# Patient Record
Sex: Female | Born: 1995 | Race: Black or African American | Hispanic: No | Marital: Single | Smoking: Never smoker
Health system: Southern US, Community
[De-identification: ages and names within clinical notes are randomized; demographics above are authoritative.]

## PROBLEM LIST (undated history)

## (undated) ENCOUNTER — Inpatient Hospital Stay (HOSPITAL_COMMUNITY): Payer: Self-pay

## (undated) DIAGNOSIS — Z789 Other specified health status: Secondary | ICD-10-CM

## (undated) HISTORY — PX: NO PAST SURGERIES: SHX2092

## (undated) HISTORY — DX: Other specified health status: Z78.9

---

## 2014-03-20 ENCOUNTER — Ambulatory Visit (INDEPENDENT_AMBULATORY_CARE_PROVIDER_SITE_OTHER): Payer: Self-pay | Admitting: *Deleted

## 2014-03-20 ENCOUNTER — Encounter: Payer: Self-pay | Admitting: *Deleted

## 2014-03-20 DIAGNOSIS — Z3480 Encounter for supervision of other normal pregnancy, unspecified trimester: Secondary | ICD-10-CM

## 2014-03-20 DIAGNOSIS — Z3201 Encounter for pregnancy test, result positive: Secondary | ICD-10-CM

## 2014-03-20 DIAGNOSIS — Z3490 Encounter for supervision of normal pregnancy, unspecified, unspecified trimester: Secondary | ICD-10-CM

## 2014-03-20 LAB — POCT PREGNANCY, URINE: PREG TEST UR: POSITIVE — AB

## 2014-03-20 NOTE — Progress Notes (Signed)
Here for pregancy test which was postiive. Wants to start prenatal care here. Will draw labs today

## 2014-03-21 LAB — OBSTETRIC PANEL
Antibody Screen: NEGATIVE
BASOS ABS: 0 10*3/uL (ref 0.0–0.1)
Basophils Relative: 0 % (ref 0–1)
EOS PCT: 3 % (ref 0–5)
Eosinophils Absolute: 0.2 10*3/uL (ref 0.0–0.7)
HEMATOCRIT: 32.6 % — AB (ref 36.0–46.0)
Hemoglobin: 11.6 g/dL — ABNORMAL LOW (ref 12.0–15.0)
Hepatitis B Surface Ag: NEGATIVE
LYMPHS ABS: 2 10*3/uL (ref 0.7–4.0)
LYMPHS PCT: 29 % (ref 12–46)
MCH: 29.3 pg (ref 26.0–34.0)
MCHC: 35.6 g/dL (ref 30.0–36.0)
MCV: 82.3 fL (ref 78.0–100.0)
Monocytes Absolute: 0.6 10*3/uL (ref 0.1–1.0)
Monocytes Relative: 8 % (ref 3–12)
NEUTROS ABS: 4.2 10*3/uL (ref 1.7–7.7)
Neutrophils Relative %: 60 % (ref 43–77)
Platelets: 363 10*3/uL (ref 150–400)
RBC: 3.96 MIL/uL (ref 3.87–5.11)
RDW: 13.2 % (ref 11.5–15.5)
RUBELLA: 5.01 {index} — AB (ref <0.90)
Rh Type: POSITIVE
WBC: 7 10*3/uL (ref 4.0–10.5)

## 2014-03-21 LAB — HIV ANTIBODY (ROUTINE TESTING W REFLEX): HIV: NONREACTIVE

## 2014-03-22 LAB — HEMOGLOBINOPATHY EVALUATION
HGB F QUANT: 0 % (ref 0.0–2.0)
Hemoglobin Other: 0 %
Hgb A2 Quant: 2.8 % (ref 2.2–3.2)
Hgb A: 97.2 % (ref 96.8–97.8)
Hgb S Quant: 0 %

## 2014-04-25 ENCOUNTER — Encounter: Payer: Self-pay | Admitting: Obstetrics and Gynecology

## 2014-04-26 ENCOUNTER — Inpatient Hospital Stay (HOSPITAL_COMMUNITY)
Admission: AD | Admit: 2014-04-26 | Discharge: 2014-04-26 | Discharge status: Against Medical Advice | Payer: Medicaid Other | Source: Ambulatory Visit | Attending: Obstetrics and Gynecology | Admitting: Obstetrics and Gynecology

## 2014-04-26 ENCOUNTER — Encounter (HOSPITAL_COMMUNITY): Payer: Self-pay | Admitting: *Deleted

## 2014-04-26 DIAGNOSIS — O26859 Spotting complicating pregnancy, unspecified trimester: Secondary | ICD-10-CM | POA: Insufficient documentation

## 2014-04-26 DIAGNOSIS — R109 Unspecified abdominal pain: Secondary | ICD-10-CM | POA: Insufficient documentation

## 2014-04-26 NOTE — MAU Note (Signed)
Has been cramping and spotting since yesterday.

## 2014-05-08 ENCOUNTER — Encounter: Payer: Self-pay | Admitting: Family Medicine

## 2014-05-08 ENCOUNTER — Ambulatory Visit (INDEPENDENT_AMBULATORY_CARE_PROVIDER_SITE_OTHER): Payer: Medicaid Other | Admitting: Family Medicine

## 2014-05-08 VITALS — BP 117/77 | HR 108 | Temp 97.3°F | Wt 117.3 lb

## 2014-05-08 DIAGNOSIS — Z3480 Encounter for supervision of other normal pregnancy, unspecified trimester: Secondary | ICD-10-CM

## 2014-05-08 DIAGNOSIS — Z3481 Encounter for supervision of other normal pregnancy, first trimester: Secondary | ICD-10-CM

## 2014-05-08 DIAGNOSIS — Z3491 Encounter for supervision of normal pregnancy, unspecified, first trimester: Secondary | ICD-10-CM

## 2014-05-08 DIAGNOSIS — O99810 Abnormal glucose complicating pregnancy: Secondary | ICD-10-CM

## 2014-05-08 DIAGNOSIS — O24419 Gestational diabetes mellitus in pregnancy, unspecified control: Secondary | ICD-10-CM | POA: Insufficient documentation

## 2014-05-08 LAB — POCT URINALYSIS DIP (DEVICE)
Glucose, UA: NEGATIVE mg/dL
HGB URINE DIPSTICK: NEGATIVE
Leukocytes, UA: NEGATIVE
Nitrite: NEGATIVE
PH: 6 (ref 5.0–8.0)
PROTEIN: 30 mg/dL — AB
SPECIFIC GRAVITY, URINE: 1.02 (ref 1.005–1.030)
UROBILINOGEN UA: 1 mg/dL (ref 0.0–1.0)

## 2014-05-08 NOTE — Progress Notes (Signed)
  Subjective:    Tamara HeinzJameisha Smith is a G3P1011 6651w0d being seen today for her first obstetrical visit.  Her obstetrical history is significant for gestational diabetes A1 with previous pregnancy. Patient does intend to breast feed. Pregnancy history fully reviewed.  Patient reports nausea. Has been vomiting throughout the day on some days.  Other days she does fine. Not taking anything.  No spotting recently.  Some cramping.  Was seen in the MAU on 6/8 and told her placenta was "low" on bedside exam but no notes in the system.    Filed Vitals:   05/08/14 1013  BP: 117/77  Pulse: 108  Temp: 97.3 F (36.3 C)  Weight: 117 lb 4.8 oz (53.207 kg)    HISTORY: OB History  Gravida Para Term Preterm AB SAB TAB Ectopic Multiple Living  3 1 1  1  1   1     # Outcome Date GA Lbr Len/2nd Weight Sex Delivery Anes PTL Lv  3 CUR           2 TAB 2012          1 TRM 03/13/10 3714w0d  8 lb (3.629 kg) M SVD EPI N Y     Comments: Esophageal Atresia     Past Medical History  Diagnosis Date  . Medical history non-contributory    Past Surgical History  Procedure Laterality Date  . No past surgeries     Family History  Problem Relation Age of Onset  . Diabetes Mother   . Hypertension Mother   . Hypertension Father   . Diabetes Maternal Aunt   . Diabetes Maternal Grandmother      Exam    Uterus:   deferred as done in MAU   Pelvic Exam:   System: Breast:  deferred per pt request   Skin: normal coloration and turgor, no rashes    Neurologic: normal   Extremities: normal strength, tone, and muscle mass   HEENT extra ocular movement intact   Mouth/Teeth mucous membranes moist, pharynx normal without lesions   Neck supple   Cardiovascular: regular rate and rhythm, no murmurs or gallops   Respiratory:  appears well, vitals normal, no respiratory distress, acyanotic, normal RR, ear and throat exam is normal, neck free of mass or lymphadenopathy, chest clear, no wheezing, crepitations, rhonchi,  normal symmetric air entry   Abdomen: soft, non-tender; bowel sounds normal; no masses,  no organomegaly      Assessment:    Pregnancy: G3P1011 Patient Active Problem List   Diagnosis Date Noted  . Supervision of normal subsequent pregnancy 05/08/2014  . hx of Gestational diabetes mellitus, currently pregnant 05/08/2014        Plan:     Initial labs revieweed Prenatal vitamins. Offered nausea medication but pt declined.  ? Placental location- advised pelvic rest and will schedule for 1st trimester screen and further eval  Will need glucola at next visit as vomited this time.  Problem list reviewed and updated. Genetic Screening discussed First Screen: requested.  Follow up in 4 weeks. 50% of 30 min visit spent on counseling and coordination of care.     BECK, KELI L 05/08/2014

## 2014-05-08 NOTE — Patient Instructions (Signed)

## 2014-05-08 NOTE — Progress Notes (Signed)
First Trimester Screen scheduled 05/11/14 at 11 am with MFM.

## 2014-05-08 NOTE — Progress Notes (Signed)
Initial prenatal information given.  Pt vomited 1 hour gtt drink/

## 2014-05-09 ENCOUNTER — Other Ambulatory Visit: Payer: Self-pay | Admitting: Family Medicine

## 2014-05-09 DIAGNOSIS — Z3682 Encounter for antenatal screening for nuchal translucency: Secondary | ICD-10-CM

## 2014-05-10 LAB — PRESCRIPTION MONITORING PROFILE (19 PANEL)
Amphetamine/Meth: NEGATIVE ng/mL
BARBITURATE SCREEN, URINE: NEGATIVE ng/mL
Benzodiazepine Screen, Urine: NEGATIVE ng/mL
Buprenorphine, Urine: NEGATIVE ng/mL
CANNABINOID SCRN UR: NEGATIVE ng/mL
CARISOPRODOL, URINE: NEGATIVE ng/mL
COCAINE METABOLITES: NEGATIVE ng/mL
CREATININE, URINE: 429.46 mg/dL (ref >20.0)
FENTANYL URINE: NEGATIVE ng/mL
MDMA URINE: NEGATIVE ng/mL
MEPERIDINE UR: NEGATIVE ng/mL
METHADONE SCREEN, URINE: NEGATIVE ng/mL
Methaqualone: NEGATIVE ng/mL
Nitrites, Initial: NEGATIVE ug/mL
OXYCODONE SCRN UR: NEGATIVE ng/mL
Opiate Screen, Urine: NEGATIVE ng/mL
PH URINE, INITIAL: 6.1 pH (ref 4.5–8.9)
PHENCYCLIDINE, UR: NEGATIVE ng/mL
Propoxyphene: NEGATIVE ng/mL
TRAMADOL UR: NEGATIVE ng/mL
Tapentadol, urine: NEGATIVE ng/mL
Zolpidem, Urine: NEGATIVE ng/mL

## 2014-05-11 ENCOUNTER — Other Ambulatory Visit: Payer: Self-pay

## 2014-05-11 ENCOUNTER — Ambulatory Visit (HOSPITAL_COMMUNITY)
Admission: RE | Admit: 2014-05-11 | Discharge: 2014-05-11 | Disposition: A | Discharge status: Home / Self Care | Payer: Medicaid Other | Source: Ambulatory Visit | Attending: Family Medicine | Admitting: Family Medicine

## 2014-05-11 DIAGNOSIS — Z36 Encounter for antenatal screening of mother: Secondary | ICD-10-CM | POA: Insufficient documentation

## 2014-05-11 DIAGNOSIS — Z3682 Encounter for antenatal screening for nuchal translucency: Secondary | ICD-10-CM

## 2014-05-12 LAB — CULTURE, OB URINE: Colony Count: >=100000

## 2014-05-14 ENCOUNTER — Encounter: Payer: Self-pay | Admitting: Family Medicine

## 2014-05-15 ENCOUNTER — Telehealth: Payer: Self-pay | Admitting: *Deleted

## 2014-05-15 DIAGNOSIS — N390 Urinary tract infection, site not specified: Secondary | ICD-10-CM

## 2014-05-15 MED ORDER — CEPHALEXIN 500 MG PO CAPS: 500.0000 mg | ORAL_CAPSULE | Freq: Four times a day (QID) | ORAL | Status: AC

## 2014-05-15 MED ORDER — CEPHALEXIN 500 MG PO CAPS: 500.0000 mg | ORAL_CAPSULE | Freq: Four times a day (QID) | ORAL | Status: DC

## 2014-05-15 NOTE — Telephone Encounter (Addendum)
Message copied by Jill SideAY, Korayma Hagwood L on Mon May 15, 2014 10:46 AM ------      Message from: Vale HavenBECK, KELI L      Created: Fri May 12, 2014  6:24 PM       Pt has a UTI. Can we call in keflex 500 qid x7 days for her? Thanks!  ------ Called pt @ listed # and spoke with a female who stated that Tamara Smith could be reached @ [phone]. When I called that #, I got a voice mail. I left a message stating that I am calling with test result information. Please call back and state whether detailed information can be left on this voice mail.

## 2014-05-15 NOTE — Telephone Encounter (Signed)
Patient left message that it is okay to leave detailed message. Also it is okay to leave message with her husband. She can be reached at [phone].

## 2014-05-15 NOTE — Telephone Encounter (Signed)
Called patient back and informed her of results. Rx sent to pharmacy.

## 2014-05-16 ENCOUNTER — Telehealth: Payer: Self-pay

## 2014-05-16 NOTE — Telephone Encounter (Signed)
Patient called stating RX was not received by [facility]. Called pharmacy and was informed the RX is ready for patient to pick up. Called patient and left message informing her of this.

## 2014-06-02 ENCOUNTER — Encounter: Payer: Self-pay | Admitting: General Practice

## 2014-06-05 ENCOUNTER — Encounter: Payer: Self-pay | Admitting: Obstetrics and Gynecology

## 2014-06-19 ENCOUNTER — Ambulatory Visit (HOSPITAL_COMMUNITY): Admission: RE | Admit: 2014-06-19 | Payer: Medicaid Other | Source: Ambulatory Visit

## 2014-06-23 ENCOUNTER — Encounter: Payer: Self-pay | Admitting: General Practice

## 2014-08-03 IMAGING — US US MFM FETAL NUCHAL TRANSLUCENCY
1 series · 14 of 17 positions shown · non-contrast
Comparison: none

[Series 1: us mfm fetal nuchal translucency · 0.20mm/px · 14 of 17 slices shown]
[im 1/17]
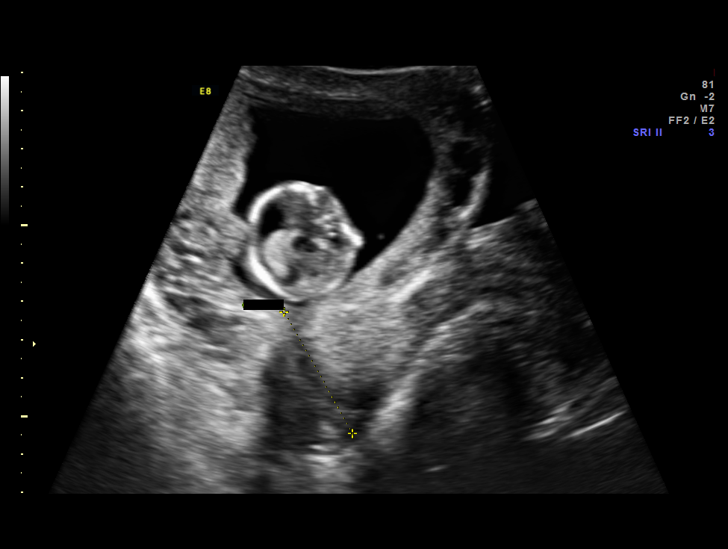
[im 2/17]
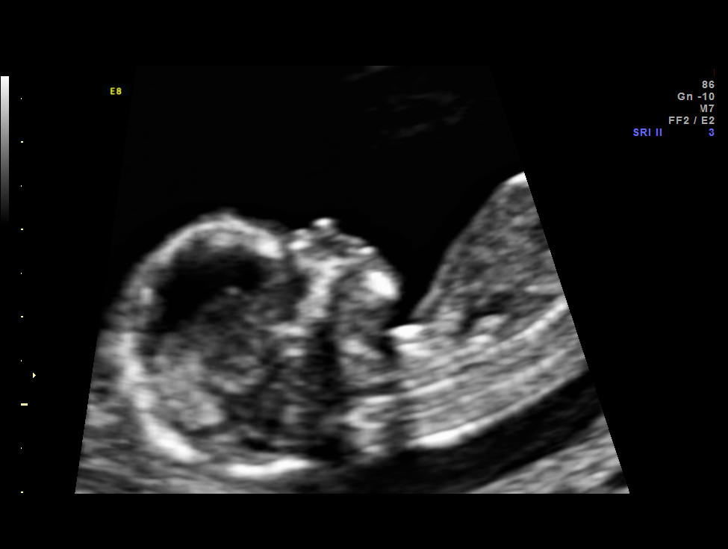
[im 4/17]
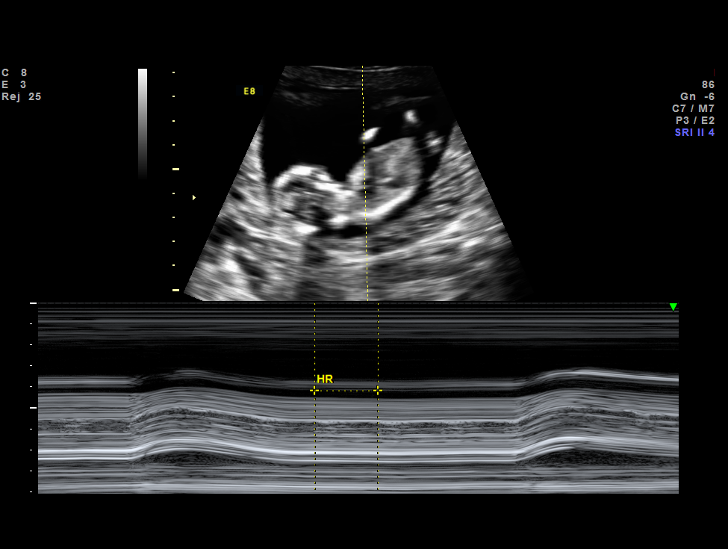
[im 5/17]
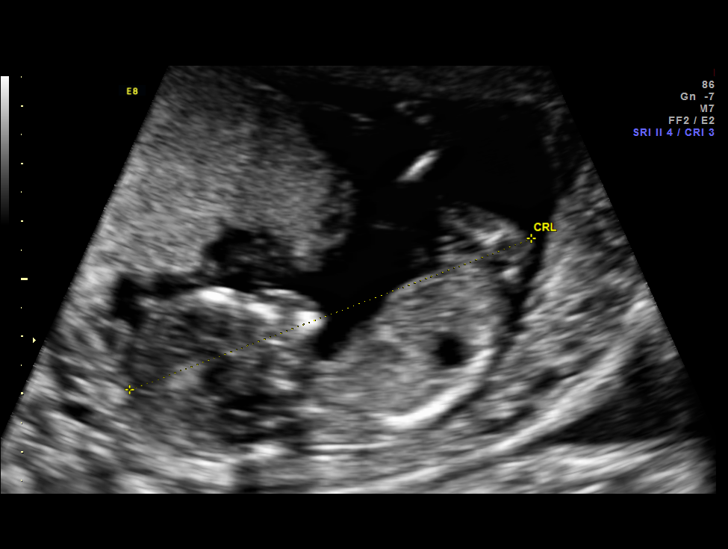
[im 6/17]
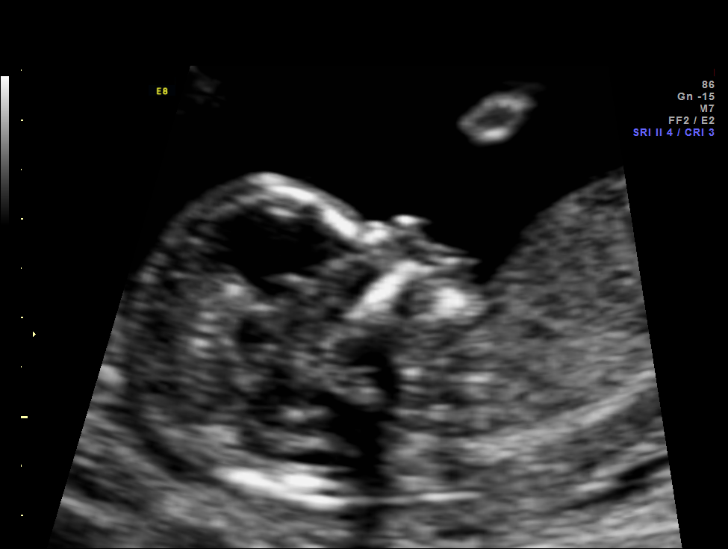
[im 7/17]
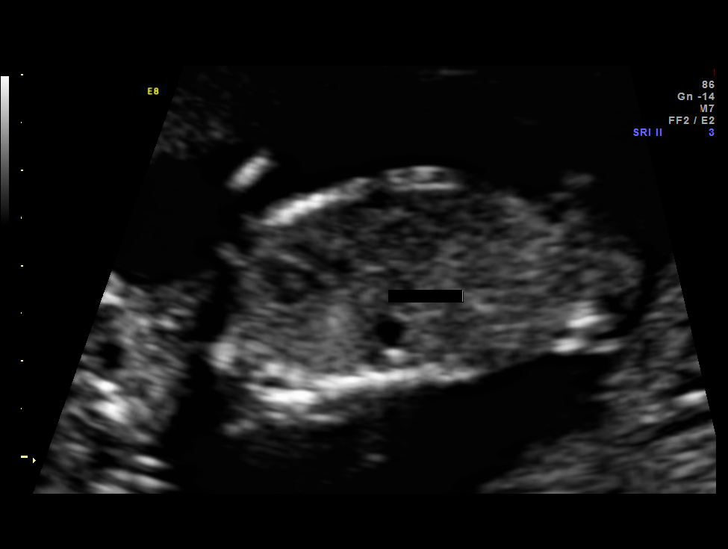
[im 8/17]
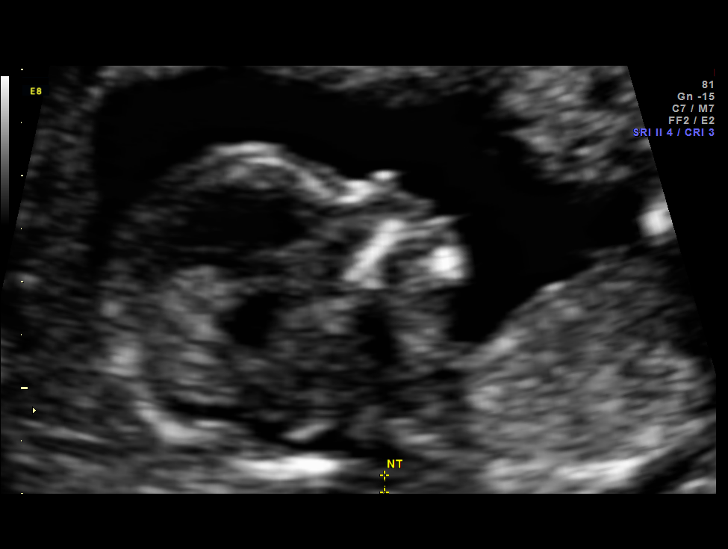
[im 10/17]
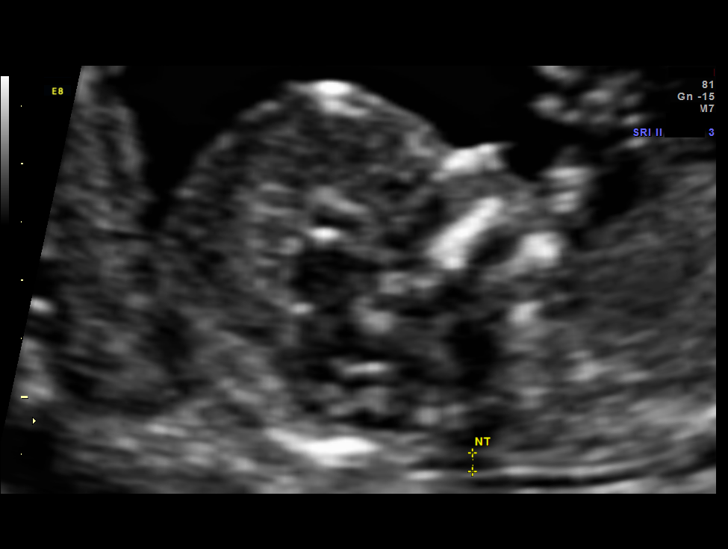
[im 11/17]
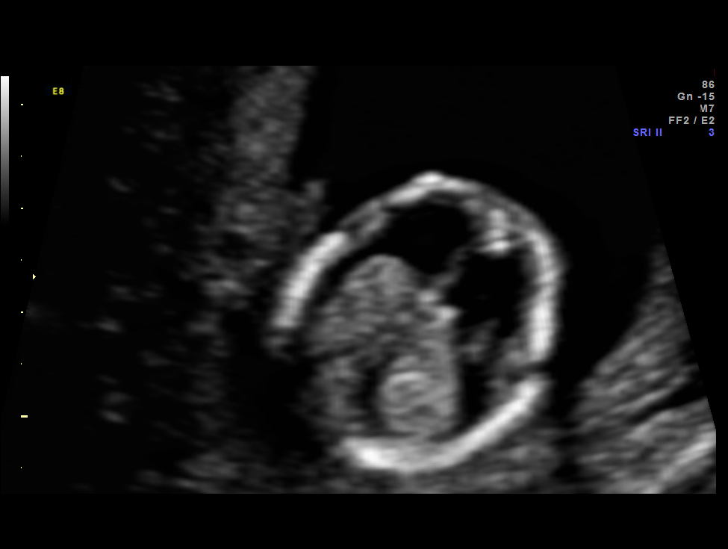
[im 12/17]
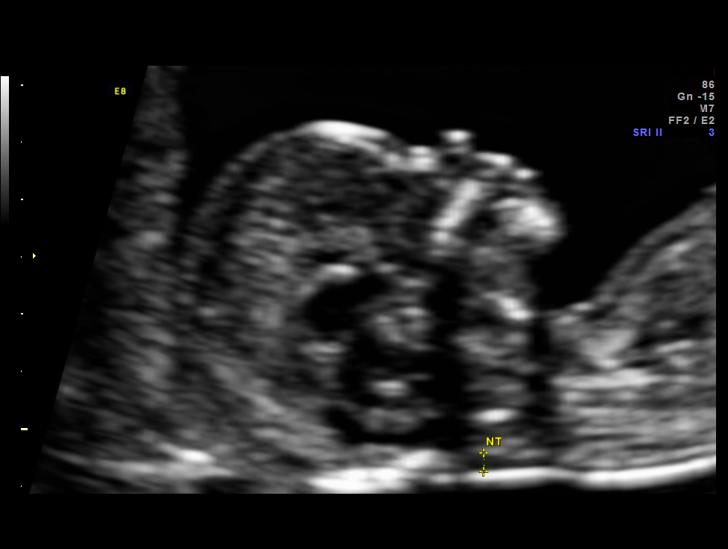
[im 13/17]
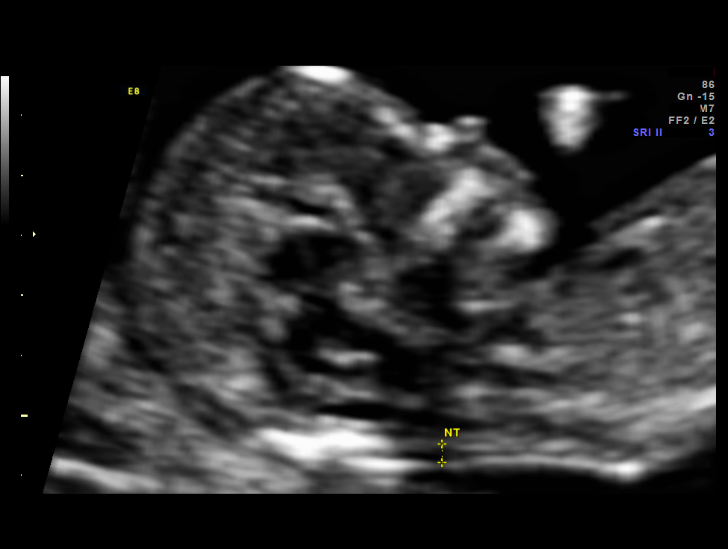
[im 14/17]
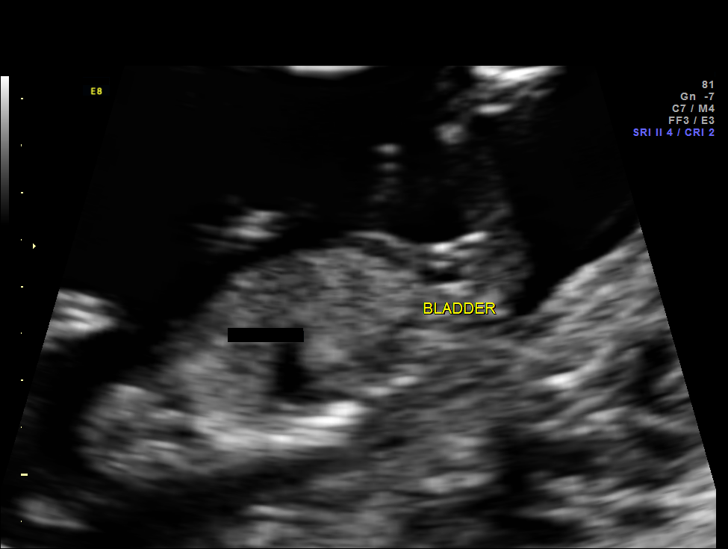
[im 16/17]
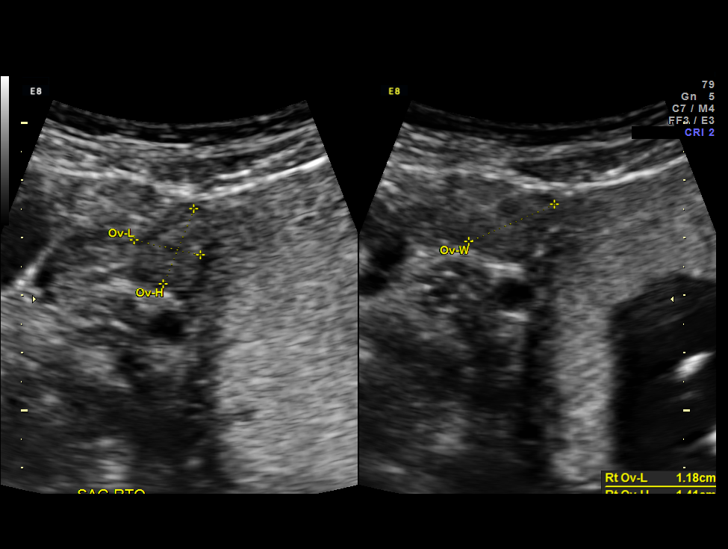
[im 17/17]
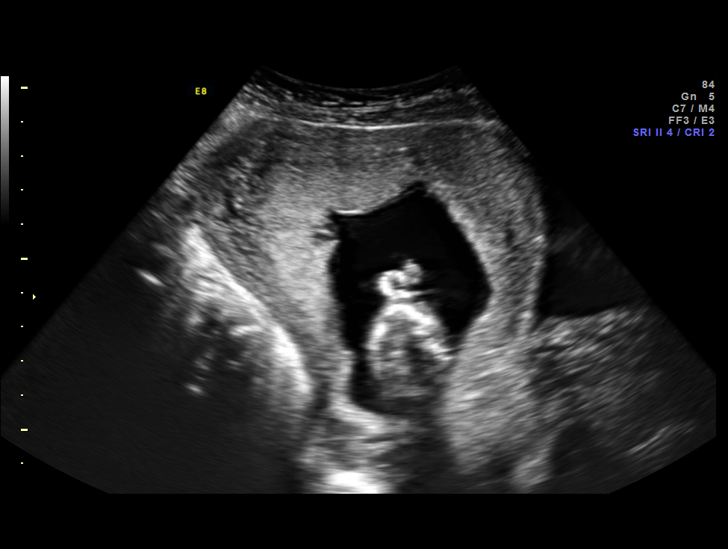

[14 of 17 positions shown; findings below may reference images not displayed]

OBSTETRICS REPORT
                      (Signed Final 05/11/2014 [DATE])

Service(s) Provided

Indications

 Poor obstetric history: Previous gestational
 diabetes
Fetal Evaluation

 Num Of Fetuses:    1
 Fetal Heart Rate:  159                          bpm
 Cardiac Activity:  Observed

 Amniotic Fluid
 AFI FV:      Subjectively within normal limits
1st Trimester Genetic Sonogram Screening

 CRL:            74.4  mm    G. Age:   13w 2d                 EDD:   11/14/14
 Nuc Trans:      1.55  mm

 Nasal Bone:                 Present
Cervix Uterus Adnexa

 Cervical Length:    3.66     cm

 Cervix:       Normal appearance by transabdominal scan.

 Left Ovary:    Size(cm) L: 3.38 x W: 1.52 x H: 1.88  Volume(cc):
 Right Ovary:   Size(cm) L: 1.18 x W: 1.63 x H: 1.41  Volume(cc):
Impression

 Single IUP at 13 [DATE] weeks
 Normal NT (1.6 mm).  Nasal bone visualized.
 First trimester aneuploidy screen performed as noted above.
Recommendations

 Please do not draw triple/quad screen, though patient should
 be offered MSAFP for neural tube defect screening.
 Recommend ultrasound for fetal anatomy at 18-20 weeks

## 2014-09-11 ENCOUNTER — Encounter: Payer: Self-pay | Admitting: Family Medicine

## 2015-03-13 ENCOUNTER — Encounter (HOSPITAL_COMMUNITY): Payer: Self-pay | Admitting: *Deleted
